# Patient Record
Sex: Female | Born: 1960 | Race: White | Hispanic: Yes | Smoking: Never smoker
Health system: Southern US, Community
[De-identification: ages and names within clinical notes are randomized; demographics above are authoritative.]

## PROBLEM LIST (undated history)

## (undated) DIAGNOSIS — N63 Unspecified lump in unspecified breast: Principal | ICD-10-CM

## (undated) HISTORY — DX: Unspecified lump in unspecified breast: N63.0

---

## 2006-12-13 ENCOUNTER — Encounter: Admission: RE | Admit: 2006-12-13 | Discharge: 2006-12-13 | Payer: Self-pay | Admitting: Internal Medicine

## 2013-12-23 DIAGNOSIS — N63 Unspecified lump in unspecified breast: Secondary | ICD-10-CM

## 2013-12-23 HISTORY — PX: BREAST LUMPECTOMY: SHX2

## 2013-12-23 HISTORY — DX: Unspecified lump in unspecified breast: N63.0

## 2014-01-23 ENCOUNTER — Ambulatory Visit (INDEPENDENT_AMBULATORY_CARE_PROVIDER_SITE_OTHER): Payer: Self-pay | Admitting: Family Medicine

## 2014-01-23 VITALS — BP 98/62 | HR 61 | Temp 98.0°F | Resp 16 | Ht 59.5 in | Wt 132.8 lb

## 2014-01-23 DIAGNOSIS — H9202 Otalgia, left ear: Secondary | ICD-10-CM

## 2014-01-23 DIAGNOSIS — H8112 Benign paroxysmal vertigo, left ear: Secondary | ICD-10-CM

## 2014-01-23 DIAGNOSIS — H8110 Benign paroxysmal vertigo, unspecified ear: Secondary | ICD-10-CM

## 2014-01-23 DIAGNOSIS — H9209 Otalgia, unspecified ear: Secondary | ICD-10-CM

## 2014-01-23 MED ORDER — MECLIZINE HCL 32 MG PO TABS: 32.0000 mg | ORAL_TABLET | Freq: Three times a day (TID) | ORAL | Status: DC | PRN

## 2014-01-23 MED ORDER — AMOXICILLIN 875 MG PO TABS: 875.0000 mg | ORAL_TABLET | Freq: Two times a day (BID) | ORAL | Status: DC

## 2014-01-23 NOTE — Patient Instructions (Signed)
Vértigo postural benigno °(Benign Positional Vertigo) ° Vértigo es la sensación de que el entorno se mueve estando quieto. Es la forma más frecuente de vértigo. Benigno significa que la causa del trastorno no es grave. Es más frecuente en adultos mayores. °CAUSAS  °Es el resultado de un trastorno en el sistema laberíntico. Es una zona en el oído medio que ayuda a controlar el equilibrio. La causa puede ser una infección viral, una lesión en la cabeza o un movimiento repetitivo. Sin embargo, a menudo no se halla causa.  °SÍNTOMAS  °Los síntomas de vértigo posicional benigno se producen al mover la cabeza o los ojos en diferentes direcciones. Algunos de los síntomas pueden ser:  °· Pérdida de equilibrio y caídas. °· Vómitos. °· Visión borrosa. °· Mareos. °· Náuseas. °· Movimientos oculares involuntarios (nistagmus). °DIAGNÓSTICO  °El vértigo postural benigno se diagnostica mediante un examen físico. Si la causa específica de su vértigo posicional benigno es desconocido, su médico puede indicar diagnósticos por imágenes, como la resonancia magnética (RM) o la tomografía computada (TC).  °TRATAMIENTO  °El médico le podrá recomendar movimientos o procedimientos para corregir el vértigo posicional benigno. Para tratar los síntomas pueden indicarse medicamentos como meclizina, benzodiazepinas y medicamentos para las náuseas. En casos raros, si los síntomas son causados   por ciertos trastornos que afectan el oído interno, es posible que necesite cirugía.  °INSTRUCCIONES PARA EL CUIDADO EN EL HOGAR  °· Siga las indicaciones del médico. °· Muévase lentamente. No haga movimientos bruscos con la cabeza ni el cuerpo. °· Evite conducir vehículos. °· Evite operar maquinarias pesadas. °· Evite realizar tareas que serían peligrosas para usted u otras personas durante un episodio de vértigo. °· Debe ingerir gran cantidad de líquido para mantener la orina de tono claro o color amarillo pálido. °SOLICITE ATENCIÓN MÉDICA DE INMEDIATO  SI:  °· Tiene dificultad para hablar, caminar, siente debilidad o tiene problemas para usar los brazos, las manos o las piernas. °· Tiene dificultad para respirar. °· Sufre un dolor de cabeza intenso. °· Las náuseas o los vómitos persisten o empeoran. °· Tiene cambios en la visión. °· Sus familiares o amigos notan cambios en su conducta. °· El dolor empeora. °· Tiene fiebre. °· Comienza a sentir rigidez en el cuello o sensibilidad a la luz. °ASEGÚRESE DE QUE:  °· Comprende estas instrucciones. °· Controlará su enfermedad. °· Solicitará ayuda de inmediato si no mejora o si empeora. °Document Released: 08/27/2008 Document Revised: 08/03/2011 °ExitCare® Patient Information ©2015 [employer]. This information is not intended to replace advice given to you by your health care provider. Make sure you discuss any questions you have with your health care provider. ° °

## 2014-01-23 NOTE — Progress Notes (Signed)
   Subjective:    Patient ID: Cathy Cross, female    DOB: 26-Nov-1960, 53 y.o.   MRN: [id number]  HPI Mr. Tedd Sias is a 53 year old female who presents with dizziness and ear pain. Onset was 1 week ago, but she complains of ear pain x 6 months. She notes the sensation of the room spinning. Location of pain is the left ear, located on the outside of the ear. She notes hypersensitivity to light touch in the area surrounding the ear, but denies any rash. She denies any history of diabetes. Symptoms are aggravated with head movement. She denies any ringing in her ear. She does note some popping and pressure in her left ear. She also notes frontal headaches. 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 She notes associated nasal congestion. She denies any photophobia, nausea, vomiting, visual changes, gait disturbance, myalgias, or fatigue. She does occasionally note some subjective fevers.  History reviewed. No pertinent past medical history. History   Social History  . Marital Status: Unknown    Spouse Name: N/A    Number of Children: N/A  . Years of Education: N/A   Occupational History  . Not on file.   Social History Main Topics  . Smoking status: Never Smoker   . Smokeless tobacco: Not on file  . Alcohol Use: No  . Drug Use: Not on file  . Sexual Activity: Not on file   Other Topics Concern  . Not on file   Social History Narrative  . No narrative on file    Review of Systems As per history of present illness. 11 point review of systems was performed is otherwise negative.    Objective:   Physical Exam BP 98/62  Pulse 61  Temp(Src) 98 F (36.7 C) (Oral)  Resp 16  Ht 4' 11.5" (1.511 m)  Wt 132 lb 12.8 oz (60.238 kg)  BMI 26.38 kg/m2  SpO2 99%  LMP 01/18/2014 General appearance: alert, cooperative and appears stated age Head: Normocephalic, without obvious abnormality, atraumatic Eyes: conjunctivae/corneas clear. PERRL, EOM's intact. Fundi  benign. Ears: normal TM's and external ear canals both ears Nose: Nares normal. Septum midline. Mucosa normal. No drainage or sinus tenderness. Throat: lips, mucosa, and tongue normal; teeth and gums normal Neck: no adenopathy, no carotid bruit, no JVD, supple, symmetrical, trachea midline and thyroid not enlarged, symmetric, no tenderness/mass/nodules Skin: Skin color, texture, turgor normal. No rashes or lesions Lymph nodes: Cervical, supraclavicular, and axillary nodes normal. Neuro: CNII-XII intact. Normal coordination. No nystagmus. Positive Dix-Hallpike testing upon sitting up.    Assessment & Plan:  1. BPPV 2. Left otalgia  Symptoms of dizziness are most consistent with BPPV, though could also be viral otitis.  -Given her pain and subjective fevers, we will cover her with empiric antibiotics for acute otitis media. She is given a prescription for amoxicillin if her symptoms do not improve. -She is given a prescription for meclizine when necessary. -If she notices worsening headache, visual changes, nausea, vomiting, persistent fevers, or for any other concerns she is to followup with our clinic or go to the emergency room. All as we discussed.  Discussed with Dr. Conley Rolls.  Dr. Joellyn Haff, DO Sports Medicine Fellow

## 2014-01-24 ENCOUNTER — Other Ambulatory Visit: Payer: Self-pay | Admitting: *Deleted

## 2014-01-24 MED ORDER — MECLIZINE HCL 25 MG PO TABS: 25.0000 mg | ORAL_TABLET | Freq: Three times a day (TID) | ORAL | Status: AC | PRN

## 2014-06-06 ENCOUNTER — Ambulatory Visit (INDEPENDENT_AMBULATORY_CARE_PROVIDER_SITE_OTHER): Payer: BLUE CROSS/BLUE SHIELD

## 2014-06-06 ENCOUNTER — Ambulatory Visit (INDEPENDENT_AMBULATORY_CARE_PROVIDER_SITE_OTHER): Payer: BLUE CROSS/BLUE SHIELD | Admitting: Family Medicine

## 2014-06-06 VITALS — BP 108/60 | HR 72 | Temp 97.9°F | Resp 16 | Ht 59.5 in | Wt 131.0 lb

## 2014-06-06 DIAGNOSIS — M19011 Primary osteoarthritis, right shoulder: Secondary | ICD-10-CM

## 2014-06-06 DIAGNOSIS — R079 Chest pain, unspecified: Secondary | ICD-10-CM

## 2014-06-06 DIAGNOSIS — N61 Inflammatory disorders of breast: Secondary | ICD-10-CM

## 2014-06-06 DIAGNOSIS — Z87898 Personal history of other specified conditions: Secondary | ICD-10-CM

## 2014-06-06 DIAGNOSIS — Z86018 Personal history of other benign neoplasm: Secondary | ICD-10-CM

## 2014-06-06 LAB — POCT URINE PREGNANCY: Preg Test, Ur: NEGATIVE

## 2014-06-06 MED ORDER — AMOXICILLIN-POT CLAVULANATE 875-125 MG PO TABS: 1.0000 | ORAL_TABLET | Freq: Two times a day (BID) | ORAL | Status: DC

## 2014-06-06 MED ORDER — MELOXICAM 7.5 MG PO TABS: 7.5000 mg | ORAL_TABLET | Freq: Every day | ORAL | Status: DC

## 2014-06-06 NOTE — Patient Instructions (Signed)
We will refer you to a breast clinic for follow-up.  Use the augmentin (antibiotic) as directed. If you are getting worse again come back!  Use the mobic (meloxicam) as needed for shoulder and back pain  Seek care if you are getting worse

## 2014-06-06 NOTE — Progress Notes (Signed)
[facility] [address], [place] [address] [phone]  Date:  06/06/2014   Name:  Cathy Cross   DOB:  26-Jan-1961   MRN:  [id number]  PCP:  No primary care provider on file.    Chief Complaint: Back Pain and Shortness of Breath   History of Present Illness:  Cathy Cross is a 54 y.o. very pleasant female patient who presents with the following:  Here today with pain in the right side of her chest for the last 2 weeks.   She had breast surgery in August.  She had a benign tumor removed- does not know more detail but was told it was "not malignant."  She had this surgery in [place] so we do not have records.  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  She is not sure if she injured herself at work thus causing her pain She has had the pain in her right breast and right upper back for about 2 weeks. It seemed to start in the breast and she notes it more when she lifts things.   She notes pain if she raises her right arm or if she breathes deeply.    LMP 02/2014.    4 or 5 days ago she noticed that her right breast seemed to be red, swollen and hot.  She used some ice on it.  It now looks better but is still a little bit red and tender.   She also has complaint of pain in her right upper back, it seems to move around her back.  She has had this since about 2007.  She also has pain in her right shoulder.  She has been treated for this in the past with some sort of Rx pill- Paper chart shows that she has used mobic in the past   She has never been a smoker.  She does not have follow-up for her breast issue here in the [place]- she is a permanent resident here.   There are no active problems to display for this patient.   History reviewed. No pertinent past medical history.  History reviewed. No pertinent past surgical history.  History  Substance Use Topics  . Smoking status: Never Smoker   .  Smokeless tobacco: Not on file  . Alcohol Use: No    History reviewed. No pertinent family history.  No Known Allergies  Medication list has been reviewed and updated.  Current Outpatient Prescriptions on File Prior to Visit  Medication Sig Dispense Refill  . B Complex Vitamins (B-COMPLEX/B-12 PO) Take by mouth.    . meclizine (ANTIVERT) 25 MG tablet Take 1 tablet (25 mg total) by mouth 3 (three) times daily as needed for dizziness. (Patient not taking: Reported on 06/06/2014) 30 tablet 0  . VITAMIN D, CHOLECALCIFEROL, PO Take by mouth.     No current facility-administered medications on file prior to visit.    Review of Systems:  As per HPI- otherwise negative.   Physical Examination: Filed Vitals:   06/06/14 1532  BP: 108/60  Pulse: 72  Temp: 97.9 F (36.6 C)  Resp: 16   Filed Vitals:   06/06/14 1532  Height: 4' 11.5" (1.511 m)  Weight: 131 lb (59.421 kg)   Body mass index is 26.03 kg/(m^2). Ideal Body Weight: Weight in (lb) to have BMI = 25: 125.6  GEN: WDWN, NAD, Non-toxic, A & O x 3  HEENT: Atraumatic, Normocephalic. Neck supple. No masses, No LAD.  Bilateral TM wnl, oropharynx normal.  PEERL,EOMI.   Ears and Nose: No external deformity. CV: RRR, No M/G/R. No JVD. No thrill. No extra heart sounds. PULM: CTA B, no wheezes, crackles, rhonchi. No retractions. No resp. distress. No accessory muscle use. ABD: S, NT, ND, +BS. No rebound. No HSM. EXTR: No c/c/e NEURO Normal gait.  PSYCH: Normally interactive. Conversant. Not depressed or anxious appearing.  Calm demeanor.  Right breast:evidence of prior surgery with healed scar over the lateral aeroela.  There is mild redness and induration around the mid breast which pt states is much improved from previous days She has mild tenderness over the right trapezius muscle  EKG: NSR with rate of 60, no ST elevation or depression  Results for orders placed or performed in visit on 06/06/14  POCT urine pregnancy  Result  Value Ref Range   Preg Test, Ur Negative    [facility] reading (PRIMARY) by  Dr. Patsy Lageropland: negative  CHEST 2 VIEW  COMPARISON: None.  FINDINGS: The heart size and mediastinal contours are within normal limits. Both lungs are clear. The visualized skeletal structures are unremarkable. Lower chest nipple shadows noted. this can be confirmed with a repeat exam including markers if needed.  IMPRESSION: No active cardiopulmonary disease.  Assessment and Plan: Right-sided chest pain - Plan: EKG 12-Lead, POCT urine pregnancy, DG Chest 2 View  History of benign breast tumor - Plan: Ambulatory referral to Breast Clinic  Primary osteoarthritis of right shoulder - Plan: meloxicam (MOBIC) 7.5 MG tablet  Breast infection - Plan: amoxicillin-clavulanate (AUGMENTIN) 875-125 MG per tablet  Here today with right breast pain which seems to be related to a recent cellulitis.  It is better now per her report but will start augmentin and refer her to breast clinic for further follow-up of her surgery/ surveillance mammograms as needed She has complaint of chronic pain in her shoulders, neck and back which has responded well to mobic in the past.  Will refill this for her to use as needed.   Given note for her job to limit lifting.  No evidence of acute cardiac or pulmonary pathology.   She is instructed to follow up if not getting better or if sx worsen  Signed Abbe AmsterdamJessica Copland, MD

## 2014-06-11 ENCOUNTER — Encounter: Payer: Self-pay | Admitting: Family Medicine

## 2014-06-11 ENCOUNTER — Telehealth: Payer: Self-pay | Admitting: Family Medicine

## 2014-06-11 DIAGNOSIS — N63 Unspecified lump in unspecified breast: Secondary | ICD-10-CM

## 2014-06-11 NOTE — Telephone Encounter (Signed)
Spoke to one of the docs at the breast center of GI.  She advised me that the pt will need a screening mammogram first, as she apparently had a benign lump removed.  I will refer her for this.

## 2014-06-22 ENCOUNTER — Ambulatory Visit (INDEPENDENT_AMBULATORY_CARE_PROVIDER_SITE_OTHER): Payer: BLUE CROSS/BLUE SHIELD

## 2014-06-22 ENCOUNTER — Ambulatory Visit (INDEPENDENT_AMBULATORY_CARE_PROVIDER_SITE_OTHER): Payer: BLUE CROSS/BLUE SHIELD | Admitting: Family Medicine

## 2014-06-22 VITALS — BP 92/60 | HR 58 | Temp 97.7°F | Resp 16 | Ht 61.0 in | Wt 128.2 lb

## 2014-06-22 DIAGNOSIS — M25511 Pain in right shoulder: Secondary | ICD-10-CM

## 2014-06-22 DIAGNOSIS — M546 Pain in thoracic spine: Secondary | ICD-10-CM

## 2014-06-22 DIAGNOSIS — M542 Cervicalgia: Secondary | ICD-10-CM

## 2014-06-22 DIAGNOSIS — M25512 Pain in left shoulder: Secondary | ICD-10-CM

## 2014-06-22 DIAGNOSIS — M549 Dorsalgia, unspecified: Secondary | ICD-10-CM

## 2014-06-22 LAB — POCT CBC
GRANULOCYTE PERCENT: 55.2 % (ref 37–80)
HCT, POC: 39.8 % (ref 37.7–47.9)
HEMOGLOBIN: 13 g/dL (ref 12.2–16.2)
LYMPH, POC: 2.4 (ref 0.6–3.4)
MCH, POC: 29 pg (ref 27–31.2)
MCHC: 32.7 g/dL (ref 31.8–35.4)
MCV: 88.8 fL (ref 80–97)
MID (cbc): 0.4 (ref 0–0.9)
MPV: 8.2 fL (ref 0–99.8)
PLATELET COUNT, POC: 270 10*3/uL (ref 142–424)
POC GRANULOCYTE: 3.5 (ref 2–6.9)
POC LYMPH PERCENT: 38.7 %L (ref 10–50)
POC MID %: 6.1 %M (ref 0–12)
RBC: 4.48 M/uL (ref 4.04–5.48)
RDW, POC: 13.2 %
WBC: 6.3 10*3/uL (ref 4.6–10.2)

## 2014-06-22 LAB — POCT SEDIMENTATION RATE: POCT SED RATE: 22 mm/hr (ref 0–22)

## 2014-06-22 MED ORDER — TRAMADOL HCL 50 MG PO TABS: ORAL_TABLET | ORAL | Status: AC

## 2014-06-22 MED ORDER — DICLOFENAC SODIUM 75 MG PO TBEC: DELAYED_RELEASE_TABLET | ORAL | Status: AC

## 2014-06-22 MED ORDER — METHOCARBAMOL 500 MG PO TABS: ORAL_TABLET | ORAL | Status: AC

## 2014-06-22 NOTE — Progress Notes (Signed)
Subjective: 54 year old lady who was here a couple of weeks ago. She had a breast infection at that time which is doing well now she says. She also complained of shoulder and neck pain. She has taken some meloxicam with not much relief. She says that she feels hot and feverish in the upper back at times. She has pains in her neck and both shoulders. This is fairly continuous but waxes and wanes. No specific injury. 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 This been going on for some time be getting worse.  Objective: Pleasant lady, speaks very adequate English although she asked if I would speak her to her in Spanish which I cannot. Her neck is supple. Some tenderness in the paraspinous muscles of the posterior neck. Shoulders have fair range of motion though she moves slowly because of the discomfort. She indicates pain in the area of the upper back between the scapula with a burning discomfort.  Assessment: Neck and upper back and bilateral shoulder pains  Plan: X-rays CBC and sedimentation rate  [facility] reading (PRIMARY) by  Dr. Alwyn RenHopper Normal neck, normal right and left shoulders  Results for orders placed or performed in visit on 06/22/14  POCT CBC  Result Value Ref Range   WBC 6.3 4.6 - 10.2 K/uL   Lymph, poc 2.4 0.6 - 3.4   POC LYMPH PERCENT 38.7 10 - 50 %L   MID (cbc) 0.4 0 - 0.9   POC MID % 6.1 0 - 12 %M   POC Granulocyte 3.5 2 - 6.9   Granulocyte percent 55.2 37 - 80 %G   RBC 4.48 4.04 - 5.48 M/uL   Hemoglobin 13.0 12.2 - 16.2 g/dL   HCT, POC 45.439.8 09.837.7 - 47.9 %   MCV 88.8 80 - 97 fL   MCH, POC 29.0 27 - 31.2 pg   MCHC 32.7 31.8 - 35.4 g/dL   RDW, POC 11.913.2 %   Platelet Count, POC 270 142 - 424 K/uL   MPV 8.2 0 - 99.8 fL   . Appears to be just from tightness of her muscles in the neck and upper back and shoulders, probably from positional working in front of herself all day long at the job place. May end up needing to get some physical therapy. See  instructions. Anti-inflammatory medication, pain medication, and muscle relaxants.

## 2014-06-22 NOTE — Patient Instructions (Signed)
You do not show any evidence of arthritis or rheumatism on the x-rays.  Your blood test is normal.  Stop the meloxicam  Take diclofenac one twice daily at breakfast and supper for pain and inflammation  Take the Robaxin (methocarbamol) one in the morning, 1 in the afternoon, and 2 at bedtime for muscle relaxant  Take the tramadol one twice daily only when needed for bad pain. Probably most important to take it at nighttime.  Stretch the shoulders about 10 times every morning and 10 times every evening as I taught you  If you keep having a lot of pain in the neck and shoulders and upper back we will need to refer you to physical therapy. We will try the above first.  Return as needed

## 2014-06-25 ENCOUNTER — Telehealth: Payer: Self-pay | Admitting: Family Medicine

## 2014-06-25 NOTE — Telephone Encounter (Addendum)
Patient presented at walk in center and stated that her employer needs more documentation about her work restrictions for the next 2 weeks. There was difficulty understanding patient due to language barrier.  [phone]

## 2014-06-25 NOTE — Telephone Encounter (Signed)
Patient presented to the walk in center

## 2014-06-26 NOTE — Telephone Encounter (Signed)
Cathy Huaavid- I had put her on 20 lb lifting restriction for 2 weeks when I saw her on 1/13.  I can write another note for her- do you think continue the same for 2 weeks more?  It looks like you saw her in follow-up a couple of days ago.  Thanks! JC

## 2014-06-28 NOTE — Telephone Encounter (Signed)
Give patient a note with 20# weight lifting restriction for 2 more weeks, return if not improved.  (Dr. Patsy Lageropland, I am asking staff to take care of this)

## 2014-06-29 NOTE — Telephone Encounter (Signed)
I tried to call pt X 3. Busy signal. Letter in nurse's box.

## 2014-07-02 NOTE — Telephone Encounter (Signed)
Letter in pick up draw. If pt call back please inform her to pick it up at 102.

## 2015-03-08 IMAGING — CR DG CERVICAL SPINE 2 OR 3 VIEWS
2 series · 2 of 2 positions shown · non-contrast
Comparison: None.

CLINICAL DATA: Neck pain without known injury.  Initial encounter.

EXAM:
CERVICAL SPINE - 2-3 VIEW

[lateral]
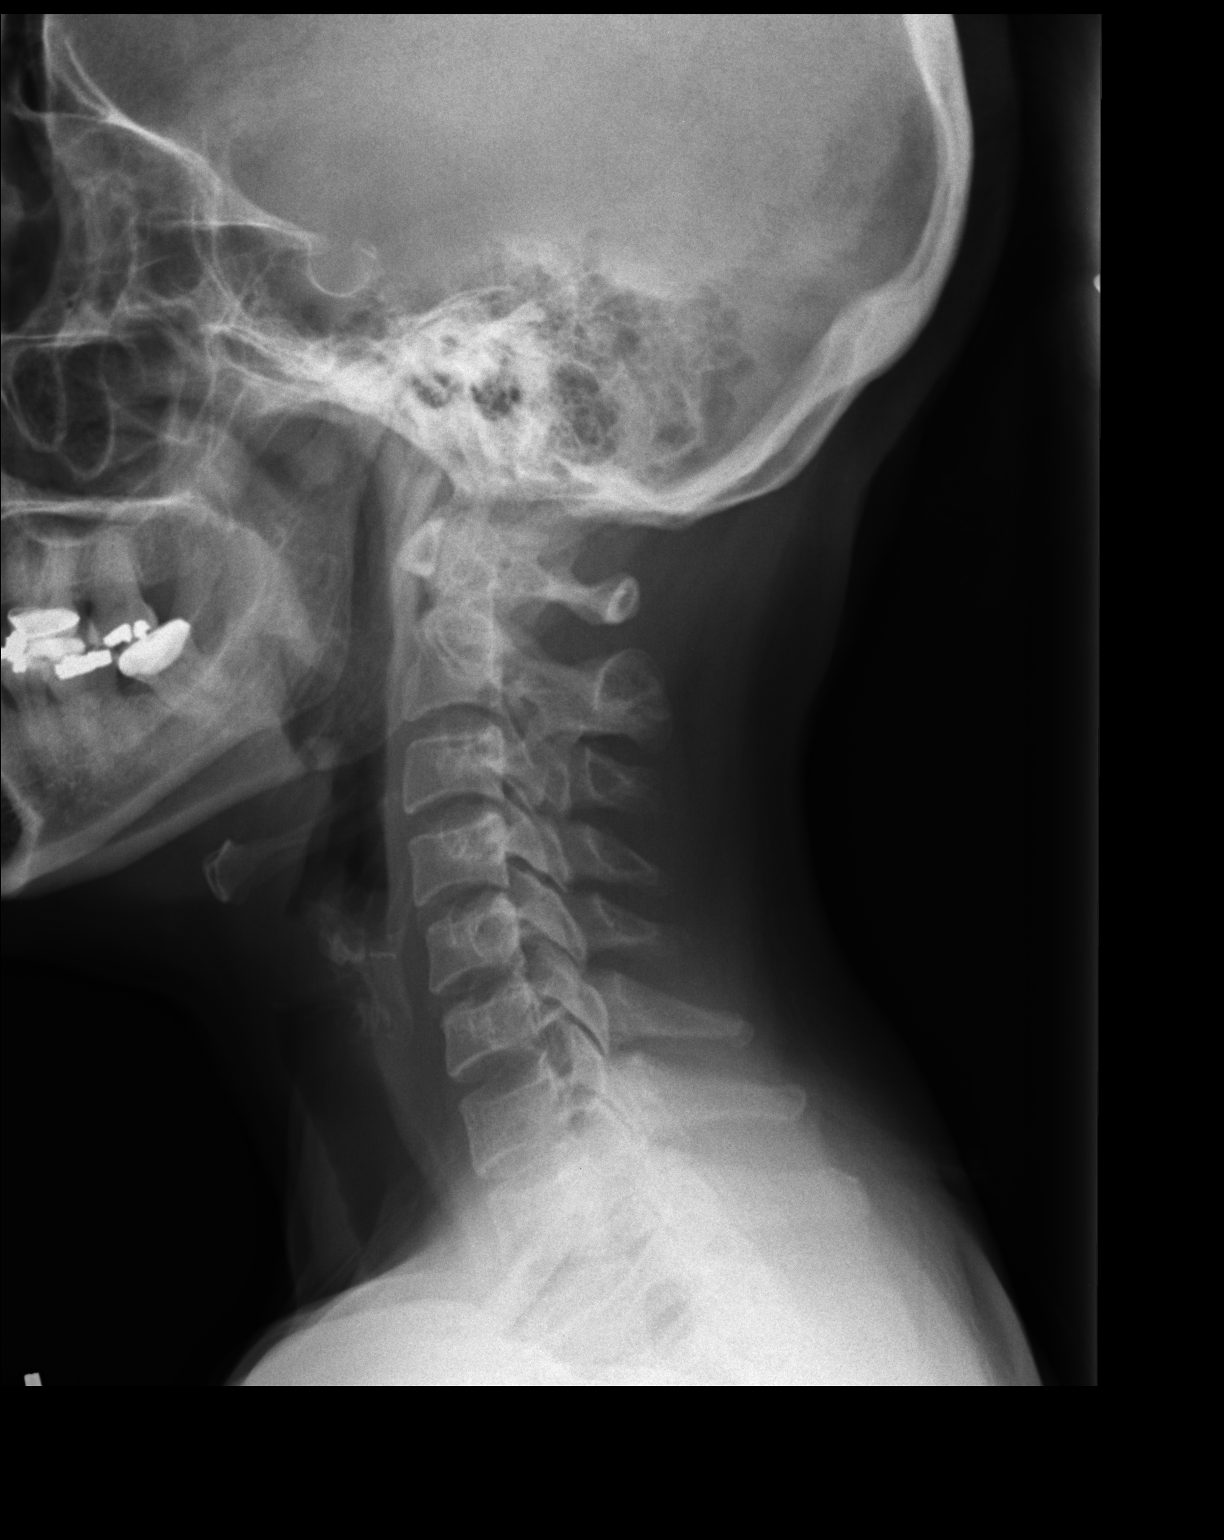

[AP]
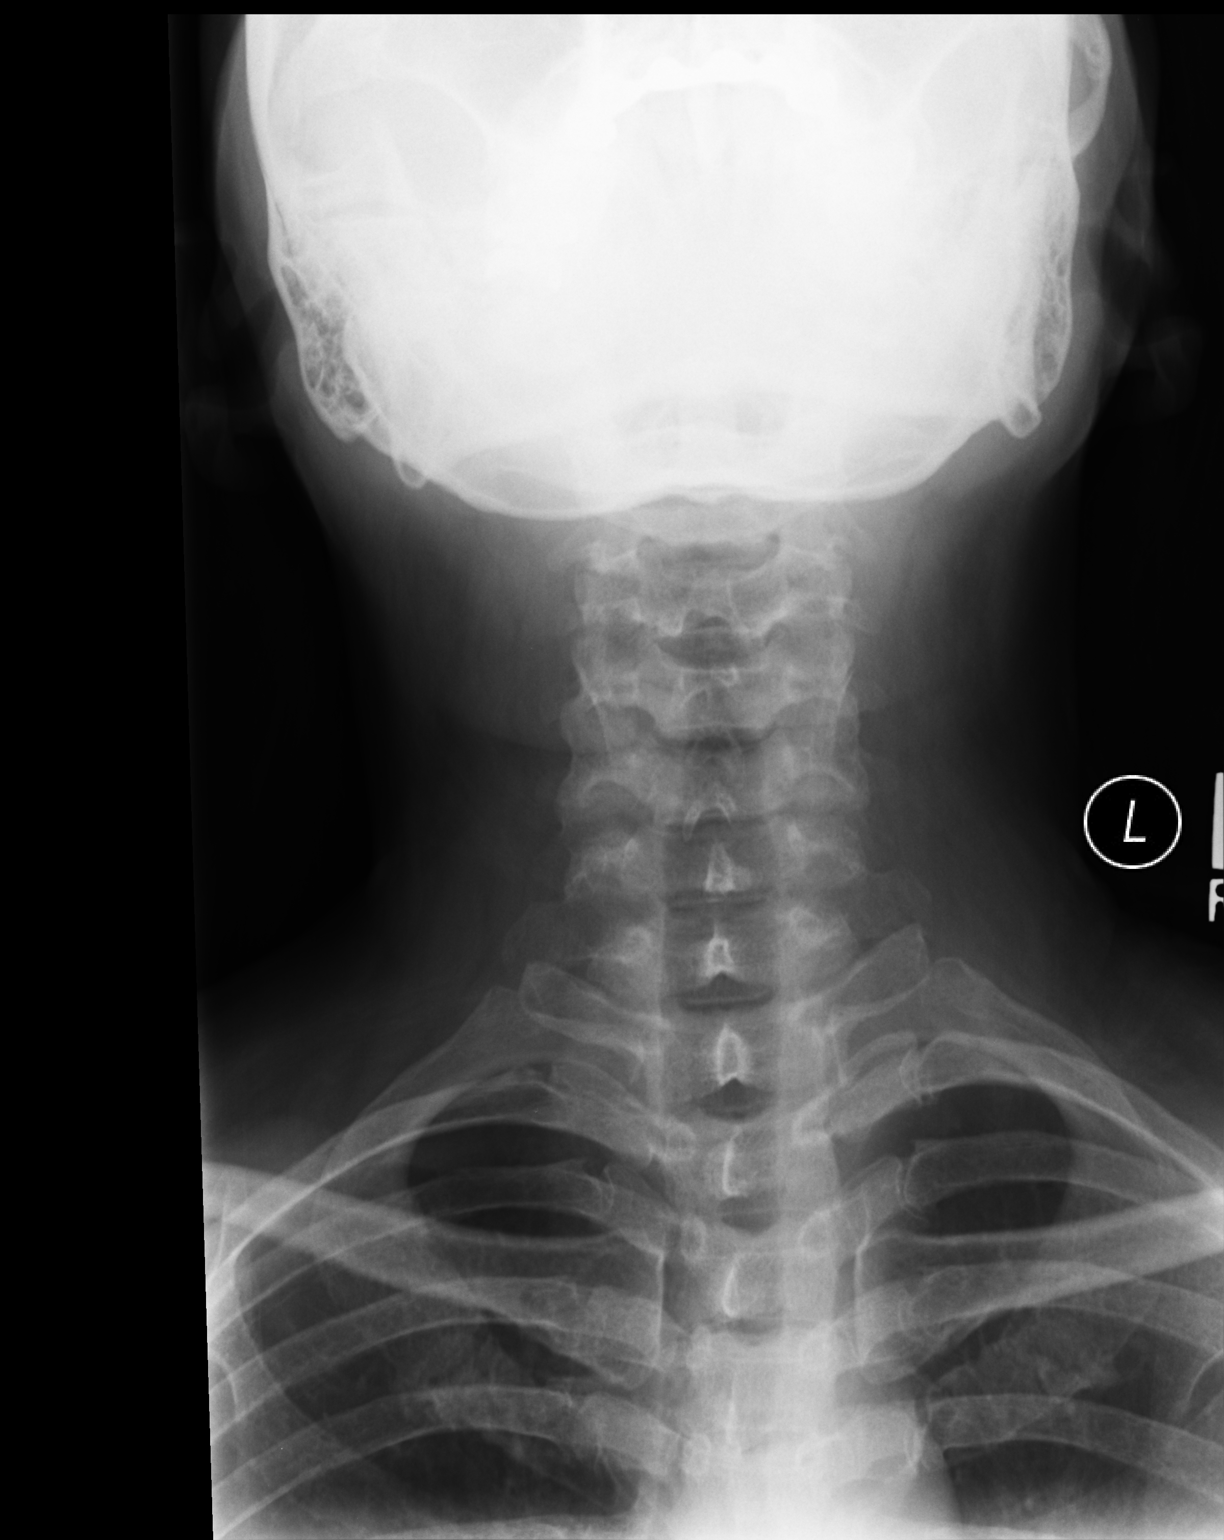

[2 of 2 positions shown; findings below may reference images not displayed]

FINDINGS: No fracture or spondylolisthesis is noted. Disc spaces are
well-maintained. No prevertebral soft tissue swelling is noted.
IMPRESSION: No definite abnormality seen in the cervical spine.

## 2015-08-15 ENCOUNTER — Encounter: Payer: Self-pay | Admitting: Family Medicine

## 2015-08-17 ENCOUNTER — Ambulatory Visit (INDEPENDENT_AMBULATORY_CARE_PROVIDER_SITE_OTHER): Payer: BLUE CROSS/BLUE SHIELD | Admitting: Family Medicine

## 2015-08-17 VITALS — BP 116/70 | HR 66 | Temp 98.4°F | Resp 18 | Ht 61.0 in | Wt 125.8 lb

## 2015-08-17 DIAGNOSIS — J029 Acute pharyngitis, unspecified: Secondary | ICD-10-CM | POA: Diagnosis not present

## 2015-08-17 DIAGNOSIS — R05 Cough: Secondary | ICD-10-CM

## 2015-08-17 DIAGNOSIS — R059 Cough, unspecified: Secondary | ICD-10-CM

## 2015-08-17 MED ORDER — HYDROCODONE-HOMATROPINE 5-1.5 MG/5ML PO SYRP: 5.0000 mL | ORAL_SOLUTION | Freq: Three times a day (TID) | ORAL | Status: AC | PRN

## 2015-08-17 MED ORDER — AMOXICILLIN 500 MG PO CAPS: 500.0000 mg | ORAL_CAPSULE | Freq: Three times a day (TID) | ORAL | Status: DC

## 2015-08-17 NOTE — Progress Notes (Signed)
Subjective:    Patient ID: Cathy Cross, female    DOB: Jul 06, 1960, 55 y.o.   MRN: [id number] By signing my name below, I, Soijett Blue, attest that this documentation has been prepared under the direction and in the presence of Elvina Sidle, MD. Electronically Signed: Soijett Blue, ED Scribe. 08/17/2015. 2:57 PM.  Chief Complaint  Patient presents with  . Sore Throat    x1 week    HPI HPI Comments: Cathy Cross is a 55 y.o. female who presents to the Emergency Department complaining of sore throat onset 1 week. She notes that her symptoms began with a fever, body aches, and chills. She states that she is having associated symptoms of productive cough, HA, generalized body aches x 3 days, resolved fever, and chills. She states that she has not tried any medications for the relief for her symptoms. She denies any other symptoms. Denies any PMHx.   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   Past Medical History  Diagnosis Date  . Breast lump 12/23/2013    removed in [place]- pt reports "benign"    No Known Allergies  Current Outpatient Prescriptions on File Prior to Visit  Medication Sig Dispense Refill  . B Complex Vitamins (B-COMPLEX/B-12 PO) Take by mouth. Reported on 08/17/2015    . VITAMIN D, CHOLECALCIFEROL, PO Take by mouth. Reported on 08/17/2015    . diclofenac (VOLTAREN) 75 MG EC tablet Take one twice daily for pain and inflammation (Patient not taking: Reported on 08/17/2015) 30 tablet 0  . meclizine (ANTIVERT) 25 MG tablet Take 1 tablet (25 mg total) by mouth 3 (three) times daily as needed for dizziness. (Patient not taking: Reported on 08/17/2015) 30 tablet 0  . methocarbamol (ROBAXIN) 500 MG tablet Take one pill morning, and one in afternoon, and 2 at bedtime for muscle relaxant. (Patient not taking: Reported on 08/17/2015) 40 tablet 0  . traMADol (ULTRAM) 50 MG tablet Take one twice daily if needed for bad pain (Patient not taking:  Reported on 08/17/2015) 30 tablet 0   No current facility-administered medications on file prior to visit.     Review of Systems  Constitutional: Positive for fever and chills.  HENT: Positive for sore throat.   Respiratory: Positive for cough.   Musculoskeletal: Positive for myalgias.  Neurological: Positive for headaches.  All other systems reviewed and are negative.      Objective:   Physical Exam  Constitutional: She is oriented to person, place, and time. She appears well-developed and well-nourished. No distress.  HENT:  Head: Normocephalic and atraumatic.  Mouth/Throat: Posterior oropharyngeal erythema present.  Eyes: EOM are normal.  Neck: Neck supple.  Cardiovascular: Normal rate, regular rhythm and normal heart sounds.   No murmur heard. Pulmonary/Chest: Effort normal and breath sounds normal. No respiratory distress. She has no wheezes. She has no rales.  Musculoskeletal: Normal range of motion.  Lymphadenopathy:    She has no cervical adenopathy.  Neurological: She is alert and oriented to person, place, and time.  Skin: Skin is warm and dry.  Psychiatric: She has a normal mood and affect. Her behavior is normal.  Nursing note and vitals reviewed.        BP 116/70 mmHg  Pulse 66  Temp(Src) 98.4 F (36.9 C) (Oral)  Resp 18  Ht  (1.549 m)  Wt 125 lb 12.8 oz (57.063 kg)  BMI 23.78 kg/m2  SpO2 99%  Assessment & Plan:   This  chart was scribed in my presence and reviewed by me personally.    ICD-9-CM ICD-10-CM   1. Cough 786.2 R05 amoxicillin (AMOXIL) 500 MG capsule     HYDROcodone-homatropine (HYCODAN) 5-1.5 MG/5ML syrup  2. Sore throat 462 J02.9 amoxicillin (AMOXIL) 500 MG capsule     HYDROcodone-homatropine (HYCODAN) 5-1.5 MG/5ML syrup     Signed, Elvina SidleKurt Dawud Mays, MD

## 2015-08-17 NOTE — Patient Instructions (Signed)

## 2015-10-01 ENCOUNTER — Ambulatory Visit (INDEPENDENT_AMBULATORY_CARE_PROVIDER_SITE_OTHER): Payer: BLUE CROSS/BLUE SHIELD | Admitting: Family Medicine

## 2015-10-01 VITALS — BP 94/60 | HR 65 | Temp 98.3°F | Resp 16 | Ht 60.0 in | Wt 129.4 lb

## 2015-10-01 DIAGNOSIS — R197 Diarrhea, unspecified: Secondary | ICD-10-CM

## 2015-10-01 DIAGNOSIS — R110 Nausea: Secondary | ICD-10-CM | POA: Diagnosis not present

## 2015-10-01 DIAGNOSIS — R42 Dizziness and giddiness: Secondary | ICD-10-CM | POA: Diagnosis not present

## 2015-10-01 DIAGNOSIS — R51 Headache: Secondary | ICD-10-CM

## 2015-10-01 DIAGNOSIS — R519 Headache, unspecified: Secondary | ICD-10-CM

## 2015-10-01 LAB — POCT CBC
Granulocyte percent: 66.7 %G (ref 37–80)
HEMATOCRIT: 34.4 % — AB (ref 37.7–47.9)
Hemoglobin: 12.1 g/dL — AB (ref 12.2–16.2)
LYMPH, POC: 1.7 (ref 0.6–3.4)
MCH, POC: 29.6 pg (ref 27–31.2)
MCHC: 35.2 g/dL (ref 31.8–35.4)
MCV: 84 fL (ref 80–97)
MID (cbc): 0.4 (ref 0–0.9)
MPV: 7.7 fL (ref 0–99.8)
POC GRANULOCYTE: 4.2 (ref 2–6.9)
POC LYMPH %: 26.4 % (ref 10–50)
POC MID %: 6.9 % (ref 0–12)
Platelet Count, POC: 204 10*3/uL (ref 142–424)
RBC: 4.1 M/uL (ref 4.04–5.48)
RDW, POC: 13.6 %
WBC: 6.3 10*3/uL (ref 4.6–10.2)

## 2015-10-01 LAB — GLUCOSE, POCT (MANUAL RESULT ENTRY): POC Glucose: 112 mg/dl — AB (ref 70–99)

## 2015-10-01 MED ORDER — ONDANSETRON HCL 4 MG PO TABS: 4.0000 mg | ORAL_TABLET | Freq: Three times a day (TID) | ORAL | Status: AC | PRN

## 2015-10-01 NOTE — Progress Notes (Signed)
[facility]    DOB: 1961-02-12  Age: 55 y.o. MRN: [id number]  Chief Complaint  Patient presents with  . Generalized Body Aches    X 2-3 days  . Headache    X 2-3 days  . Dizziness    Subjective:   Patient is been feeling ill for the past week with headaches, generalized malaise, lightheaded type dizziness, nausea without vomiting, and some diarrhea. She says she might of had some fevers off and on. No one else in the family has been sick. She just doesn't feel good.  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  Current allergies, medications, problem list, past/family and social histories reviewed.  Objective:  BP 94/60 mmHg  Pulse 65  Temp(Src) 98.3 F (36.8 C) (Oral)  Resp 16  Ht 5' (1.524 m)  Wt 129 lb 6.4 oz (58.695 kg)  BMI 25.27 kg/m2  SpO2 97%  No major distress. TMs normal. Eyes PERRLA. Throat clear. Neck supple without nodes. No carotid bruits. Chest clear to auscultation. Heart regular without murmurs. Abdomen soft without organomegaly, masses or tenderness.  Assessment & Plan:   Assessment: 1. Nonintractable headache, unspecified chronicity pattern, unspecified headache type   2. Lightheadedness   3. Nausea without vomiting   4. Diarrhea, unspecified type       Plan: Probably has a viral syndrome triggering this. Will check a glucose and CBC.  Orders Placed This Encounter  Procedures  . POCT glucose (manual entry)  . POCT CBC   Results for orders placed or performed in visit on 10/01/15  POCT glucose (manual entry)  Result Value Ref Range   POC Glucose 112 (A) 70 - 99 mg/dl  POCT CBC  Result Value Ref Range   WBC 6.3 4.6 - 10.2 K/uL   Lymph, poc 1.7 0.6 - 3.4   POC LYMPH PERCENT 26.4 10 - 50 %L   MID (cbc) 0.4 0 - 0.9   POC MID % 6.9 0 - 12 %M   POC Granulocyte 4.2 2 - 6.9   Granulocyte percent 66.7 37 - 80 %G   RBC 4.10 4.04 - 5.48 M/uL   Hemoglobin 12.1 (A) 12.2 - 16.2 g/dL   HCT, POC 40.934.4 (A) 81.137.7 - 47.9 %   MCV  84.0 80 - 97 fL   MCH, POC 29.6 27 - 31.2 pg   MCHC 35.2 31.8 - 35.4 g/dL   RDW, POC 91.413.6 %   Platelet Count, POC 204 142 - 424 K/uL   MPV 7.7 0 - 99.8 fL    Meds ordered this encounter  Medications  . acetaminophen (TYLENOL) 325 MG tablet    Sig: Take 650 mg by mouth every 6 (six) hours as needed.  . ondansetron (ZOFRAN) 4 MG tablet    Sig: Take 1 tablet (4 mg total) by mouth every 8 (eight) hours as needed for nausea or vomiting.    Dispense:  10 tablet    Refill:  0         Patient Instructions  This appears to be a virus infection. It should run its course in the next few days. Your labs are normal.  Drink lots of fluids and get enough rest. Taking more water usually will help the dizziness and lightheadedness.  Take ibuprofen 200 mg 3 pills 3 times daily if needed for headache and body aches  Take the ondansetron 4 mg every 6 or 8 hours if needed for nausea  If needed take over-the-counter loperamide) Imodium)  for the diarrhea.  Return if worse or not improving       Return if symptoms worsen or fail to improve.   Reedy Biernat, MD 10/01/2015

## 2015-10-01 NOTE — Patient Instructions (Addendum)
This appears to be a virus infection. It should run its course in the next few days. Your labs are normal.  Drink lots of fluids and get enough rest. Taking more water usually will help the dizziness and lightheadedness.  Take ibuprofen 200 mg 3 pills 3 times daily if needed for headache and body aches  Take the ondansetron 4 mg every 6 or 8 hours if needed for nausea  If needed take over-the-counter loperamide) Imodium) for the diarrhea.  Return if worse or not improving

## 2015-11-25 ENCOUNTER — Ambulatory Visit (INDEPENDENT_AMBULATORY_CARE_PROVIDER_SITE_OTHER): Payer: BLUE CROSS/BLUE SHIELD | Admitting: Physician Assistant

## 2015-11-25 VITALS — BP 118/76 | HR 57 | Temp 98.4°F | Resp 18 | Ht 60.0 in | Wt 127.0 lb

## 2015-11-25 DIAGNOSIS — M546 Pain in thoracic spine: Secondary | ICD-10-CM | POA: Diagnosis not present

## 2015-11-25 DIAGNOSIS — M5412 Radiculopathy, cervical region: Secondary | ICD-10-CM | POA: Diagnosis not present

## 2015-11-25 LAB — POCT CBC
GRANULOCYTE PERCENT: 50.4 % (ref 37–80)
HEMATOCRIT: 34.3 % — AB (ref 37.7–47.9)
HEMOGLOBIN: 11.9 g/dL — AB (ref 12.2–16.2)
LYMPH, POC: 2.8 (ref 0.6–3.4)
MCH: 29.3 pg (ref 27–31.2)
MCHC: 34.6 g/dL (ref 31.8–35.4)
MCV: 84.6 fL (ref 80–97)
MID (cbc): 0.5 (ref 0–0.9)
MPV: 7.9 fL (ref 0–99.8)
POC GRANULOCYTE: 3.3 (ref 2–6.9)
POC LYMPH PERCENT: 42.5 %L (ref 10–50)
POC MID %: 7.1 % (ref 0–12)
Platelet Count, POC: 213 10*3/uL (ref 142–424)
RBC: 4.05 M/uL (ref 4.04–5.48)
RDW, POC: 13.2 %
WBC: 6.5 10*3/uL (ref 4.6–10.2)

## 2015-11-25 LAB — GLUCOSE, POCT (MANUAL RESULT ENTRY): POC GLUCOSE: 88 mg/dL (ref 70–99)

## 2015-11-25 MED ORDER — PREDNISONE 20 MG PO TABS: ORAL_TABLET | ORAL | Status: AC

## 2015-11-25 NOTE — Progress Notes (Signed)
11/25/2015 2:33 PM   DOB: February 03, 1961 / MRN: [id number]  SUBJECTIVE:  Cathy Cross is a 55 y.o. female presenting for neck and throacic back pain.  She associates left arm numbness to the left finger which she feels is getting worse.  She has had these symptoms for roughly 1 years.  She has tried diclofenac for a few weeks now and reports this has helped some. She denies weakness and change of dexterity.  No trauma.  She is concerned that this represents "pneumonia."   She has No Known Allergies.   She  has a past medical history of Breast lump (12/23/2013).    She  reports that she has never smoked. She does not have any smokeless tobacco history on file. She reports that she does not drink alcohol. She  has no sexual activity history on file. The patient  has past surgical history that includes Breast lumpectomy (Right, 12/23/2013).  Her family history is not on file.  Review of Systems  Constitutional: Negative for fever and chills.  Gastrointestinal: Negative for nausea.  Musculoskeletal: Negative for myalgias.  Skin: Negative for itching and rash.  Neurological: Negative for dizziness and headaches.    Problem list and medications reviewed and updated by myself where necessary, and exist elsewhere in the encounter.   OBJECTIVE:  BP 118/76 mmHg  Pulse 57  Temp(Src) 98.4 F (36.9 C) (Oral)  Resp 18  Ht 5' (1.524 m)  Wt 127 lb (57.607 kg)  BMI 24.80 kg/m2  SpO2 99%  Physical Exam  Constitutional: She is oriented to person, place, and time. She appears well-nourished. No distress.  Eyes: EOM are normal. Pupils are equal, round, and reactive to light.  Cardiovascular: Normal rate.   Pulmonary/Chest: Effort normal and breath sounds normal. She has no wheezes. She has no rales.  Abdominal: She exhibits no distension.  Neurological: She is alert and oriented to person, place, and time. She has normal strength and normal reflexes. She displays no atrophy and no tremor. No  cranial nerve deficit or sensory deficit. She exhibits normal muscle tone. She displays no seizure activity. Coordination and gait normal. GCS eye subscore is 4. GCS verbal subscore is 5. GCS motor subscore is 6.  Skin: Skin is dry. She is not diaphoretic.  Psychiatric: She has a normal mood and affect.  Vitals reviewed.   Results for orders placed or performed in visit on 11/25/15 (from the past 72 hour(s))  POCT CBC     Status: Abnormal   Collection Time: 11/25/15  2:30 PM  Result Value Ref Range   WBC 6.5 4.6 - 10.2 K/uL   Lymph, poc 2.8 0.6 - 3.4   POC LYMPH PERCENT 42.5 10 - 50 %L   MID (cbc) 0.5 0 - 0.9   POC MID % 7.1 0 - 12 %M   POC Granulocyte 3.3 2 - 6.9   Granulocyte percent 50.4 37 - 80 %G   RBC 4.05 4.04 - 5.48 M/uL   Hemoglobin 11.9 (A) 12.2 - 16.2 g/dL   HCT, POC 40.934.3 (A) 81.137.7 - 47.9 %   MCV 84.6 80 - 97 fL   MCH, POC 29.3 27 - 31.2 pg   MCHC 34.6 31.8 - 35.4 g/dL   RDW, POC 91.413.2 %   Platelet Count, POC 213 142 - 424 K/uL   MPV 7.9 0 - 99.8 fL  POCT glucose (manual entry)     Status: None   Collection Time: 11/25/15  2:30 PM  Result  Value Ref Range   POC Glucose 88 70 - 99 mg/dl    No results found.  ASSESSMENT AND PLAN  Cathy Cross was seen today for chest pain and back pain.  Diagnoses and all orders for this visit:  Left-sided thoracic back pain: CBC WNL.  Will tried her with prednisone.  RTC if this fails to help.  -     POCT CBC  Cervical radiculopathy -     POCT glucose (manual entry)   The patient was advised to call or return to clinic if she does not see an improvement in symptoms or to seek the care of the closest emergency department if she worsens with the above plan.   Deliah BostonMichael Clark, MHS, PA-C [facility] 11/25/2015 2:33 PM

## 2015-11-25 NOTE — Patient Instructions (Signed)
     IF you received an x-ray today, you will receive an invoice from [facility]. Please contact [facility] at [phone] with questions or concerns regarding your invoice.   IF you received labwork today, you will receive an invoice from [facility]. Please contact [facility] at [phone] with questions or concerns regarding your invoice.   Our billing staff will not be able to assist you with questions regarding bills from these companies.  You will be contacted with the lab results as soon as they are available. The fastest way to get your results is to activate your My Chart account. Instructions are located on the last page of this paperwork. If you have not heard from us regarding the results in 2 weeks, please contact this office.     We recommend that you schedule a mammogram for breast cancer screening. Typically, you do not need a referral to do this. Please contact a local imaging center to schedule your mammogram.  [facility] - [phone]  *ask for the Radiology Department [facility]) - [phone] or [phone]  [facility] - [phone] [facility] - [phone] [facility] - [phone]  *ask for the Radiology Department [facility] - [phone]  *ask for the Radiology Department [facility] - [phone]  *ask for the Mammography Department [facility] - [phone]  

## 2016-07-25 ENCOUNTER — Ambulatory Visit: Payer: BLUE CROSS/BLUE SHIELD
